# Patient Record
Sex: Female | Born: 1995 | Race: Black or African American | Hispanic: No | Marital: Single
Health system: Southern US, Community
[De-identification: ages and names within clinical notes are randomized; demographics above are authoritative.]

---

## 2019-07-04 ENCOUNTER — Other Ambulatory Visit: Payer: Self-pay

## 2019-07-04 DIAGNOSIS — Z20822 Contact with and (suspected) exposure to covid-19: Secondary | ICD-10-CM

## 2019-07-05 LAB — NOVEL CORONAVIRUS, NAA: SARS-CoV-2, NAA: NOT DETECTED

## 2020-08-11 ENCOUNTER — Emergency Department (HOSPITAL_COMMUNITY): Payer: No Typology Code available for payment source

## 2020-08-11 ENCOUNTER — Emergency Department (HOSPITAL_COMMUNITY)
Admission: EM | Admit: 2020-08-11 | Discharge: 2020-08-11 | Disposition: A | Discharge status: Home / Self Care | Payer: No Typology Code available for payment source | Attending: Emergency Medicine | Admitting: Emergency Medicine

## 2020-08-11 ENCOUNTER — Other Ambulatory Visit: Payer: Self-pay

## 2020-08-11 DIAGNOSIS — Y92410 Unspecified street and highway as the place of occurrence of the external cause: Secondary | ICD-10-CM | POA: Diagnosis not present

## 2020-08-11 DIAGNOSIS — S0083XA Contusion of other part of head, initial encounter: Secondary | ICD-10-CM | POA: Insufficient documentation

## 2020-08-11 DIAGNOSIS — M542 Cervicalgia: Secondary | ICD-10-CM | POA: Diagnosis not present

## 2020-08-11 DIAGNOSIS — R519 Headache, unspecified: Secondary | ICD-10-CM | POA: Diagnosis not present

## 2020-08-11 MED ORDER — TRAMADOL HCL 50 MG PO TABS
50.0000 mg | ORAL_TABLET | Freq: Once | ORAL | Status: AC
  Administered 2020-08-11: 50 mg via ORAL
  Filled 2020-08-11: qty 1

## 2020-08-11 NOTE — Discharge Instructions (Addendum)
It was our pleasure to provide your ER care today - we hope that you feel better.  Rest. Take ibuprofen or aleve as need.   Follow up with primary care doctor in 1 week if symptoms fail to improve/resolve.  Return to ER if worse, new symptoms, new or severe pain, persistent vomiting, numbness/weakness, or other concern.   You were given pain medication in the ER - no driving for the next 6 hours.
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
